# Patient Record
Sex: Male | Born: 2000 | Race: Black or African American | Hispanic: No | Marital: Single | Smoking: Never smoker
Health system: Southern US, Community
[De-identification: ages and names within clinical notes are randomized; demographics above are authoritative.]

---
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
## 2019-07-09 IMAGING — CR DG FINGER INDEX 2+V*L*
3 series · 3 of 3 positions shown · non-contrast
Comparison: None

CLINICAL DATA: Laceration to pad of distal end of second digit.

EXAM:
LEFT INDEX FINGER 2+V

[x finger pa left]
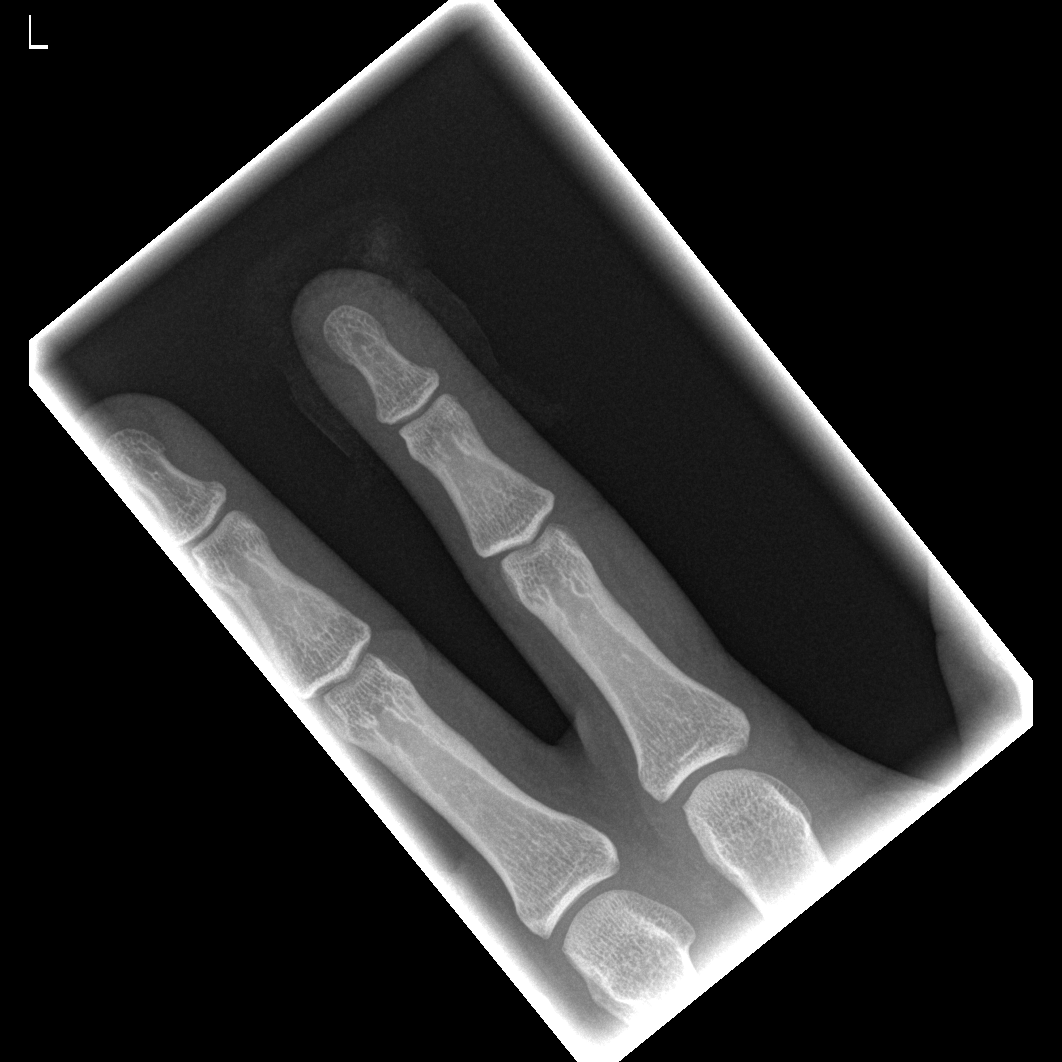

[x finger obl. left]
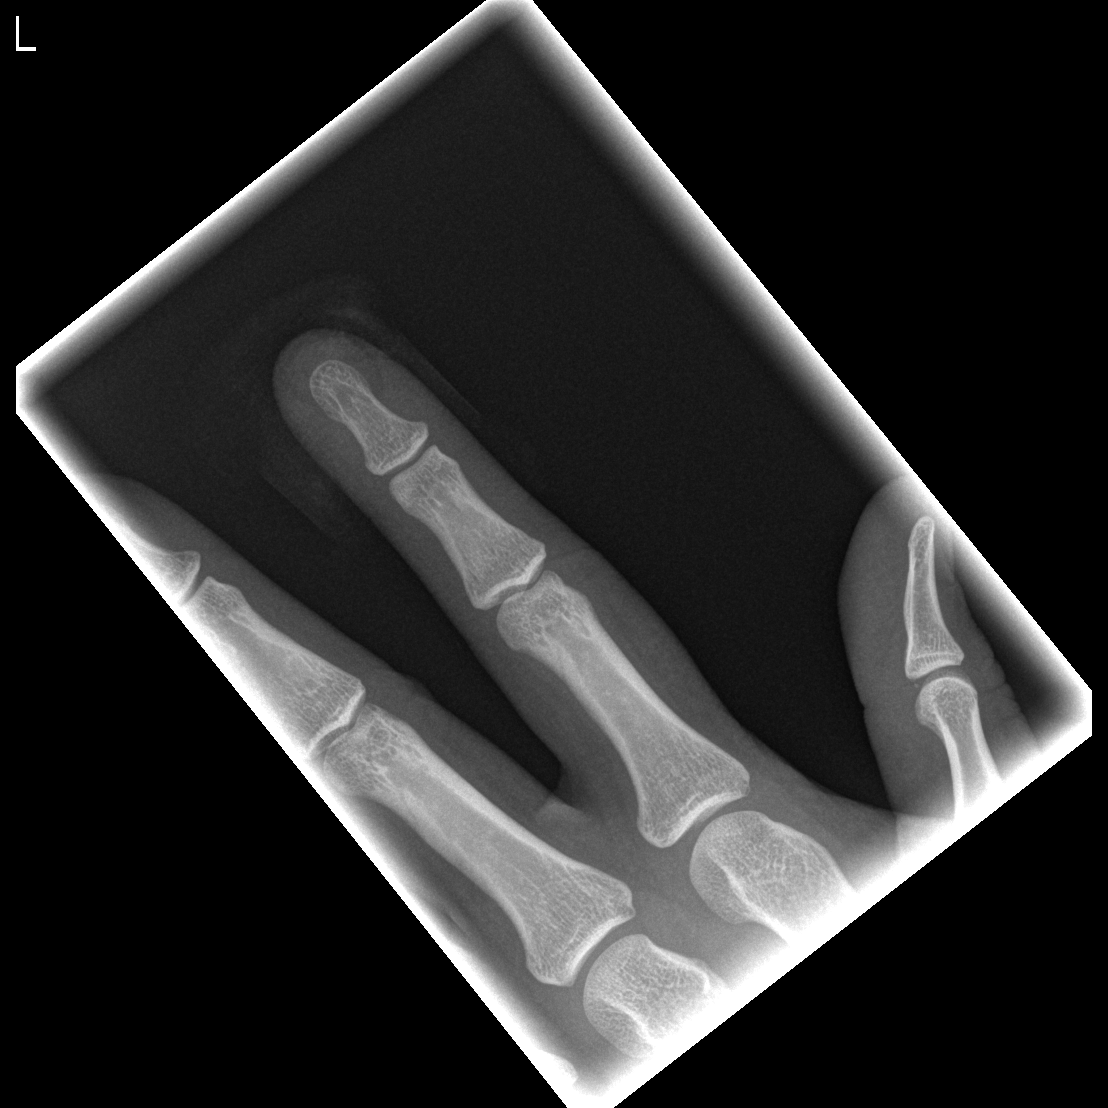

[x finger lateral left]
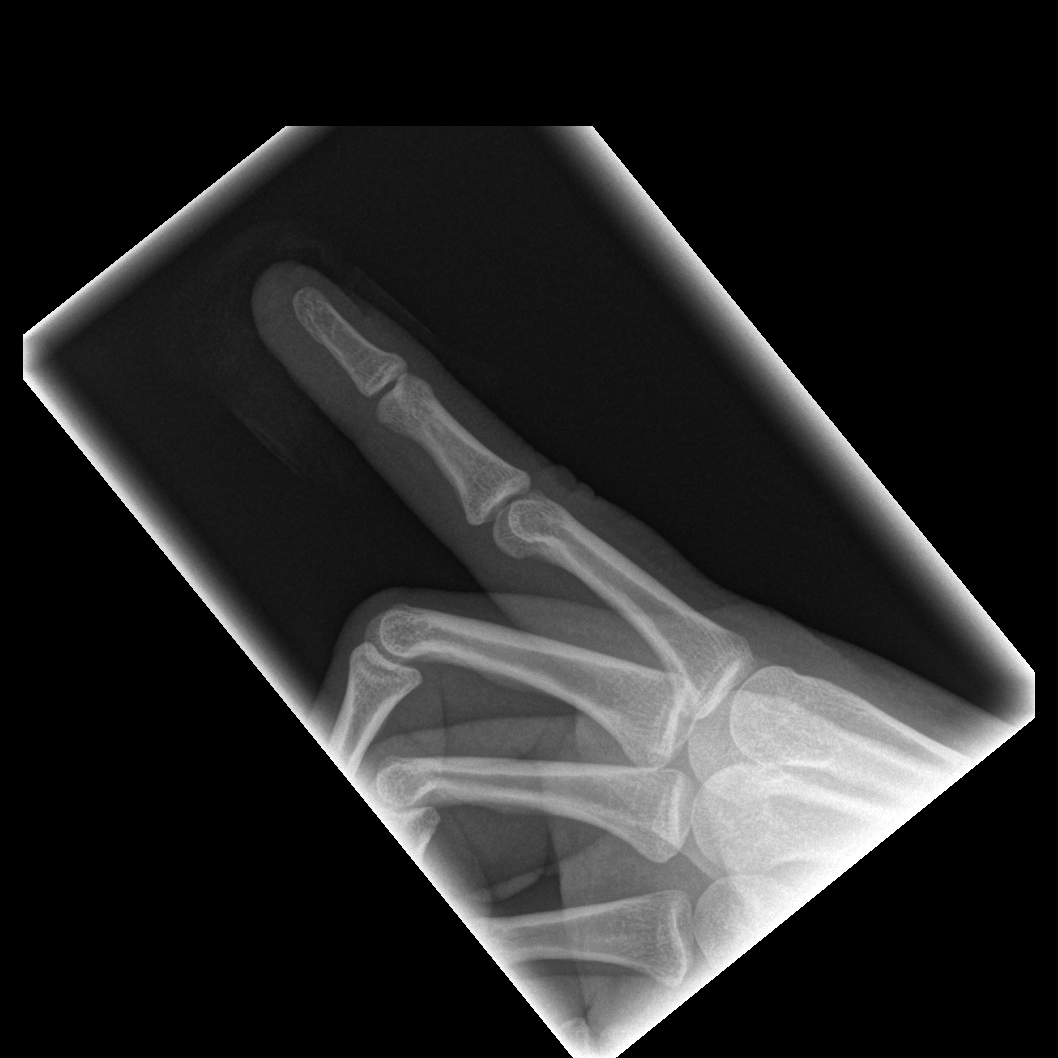

[3 of 3 positions shown; findings below may reference images not displayed]

FINDINGS: There is a soft tissue laceration overlying the tuft of the distal
phalanx. No underlying bone abnormality. No radio-opaque foreign
bodies noted.
IMPRESSION: 1. No acute bone abnormality.
2. Soft tissue laceration.
